# Patient Record
Sex: Female | Born: 1990 | Race: White | Hispanic: No | Marital: Married | Smoking: Never smoker
Health system: Southern US, Community
[De-identification: ages and names within clinical notes are randomized; demographics above are authoritative.]

## PROBLEM LIST (undated history)

## (undated) DIAGNOSIS — E079 Disorder of thyroid, unspecified: Secondary | ICD-10-CM

---

## 2016-08-14 ENCOUNTER — Emergency Department (HOSPITAL_COMMUNITY)
Admission: EM | Admit: 2016-08-14 | Discharge: 2016-08-14 | Disposition: A | Discharge status: Home / Self Care | Payer: 59 | Attending: Emergency Medicine | Admitting: Emergency Medicine

## 2016-08-14 ENCOUNTER — Encounter (HOSPITAL_COMMUNITY): Payer: Self-pay | Admitting: *Deleted

## 2016-08-14 DIAGNOSIS — R000 Tachycardia, unspecified: Secondary | ICD-10-CM | POA: Insufficient documentation

## 2016-08-14 HISTORY — DX: Disorder of thyroid, unspecified: E07.9

## 2016-08-14 LAB — COMPREHENSIVE METABOLIC PANEL
ALT: 23 U/L (ref 14–54)
AST: 31 U/L (ref 15–41)
Albumin: 4.4 g/dL (ref 3.5–5.0)
Alkaline Phosphatase: 37 U/L — ABNORMAL LOW (ref 38–126)
Anion gap: 9 (ref 5–15)
BILIRUBIN TOTAL: 0.6 mg/dL (ref 0.3–1.2)
BUN: 11 mg/dL (ref 6–20)
CO2: 23 mmol/L (ref 22–32)
Calcium: 9.1 mg/dL (ref 8.9–10.3)
Chloride: 105 mmol/L (ref 101–111)
Creatinine, Ser: 0.96 mg/dL (ref 0.44–1.00)
Glucose, Bld: 110 mg/dL — ABNORMAL HIGH (ref 65–99)
POTASSIUM: 3.6 mmol/L (ref 3.5–5.1)
Sodium: 137 mmol/L (ref 135–145)
TOTAL PROTEIN: 7.1 g/dL (ref 6.5–8.1)

## 2016-08-14 LAB — CBC WITH DIFFERENTIAL/PLATELET
BASOS ABS: 0 10*3/uL (ref 0.0–0.1)
Basophils Relative: 0 %
EOS PCT: 0 %
Eosinophils Absolute: 0 10*3/uL (ref 0.0–0.7)
HEMATOCRIT: 41.4 % (ref 36.0–46.0)
Hemoglobin: 13.6 g/dL (ref 12.0–15.0)
LYMPHS ABS: 2 10*3/uL (ref 0.7–4.0)
LYMPHS PCT: 28 %
MCH: 29.8 pg (ref 26.0–34.0)
MCHC: 32.9 g/dL (ref 30.0–36.0)
MCV: 90.6 fL (ref 78.0–100.0)
MONO ABS: 0.5 10*3/uL (ref 0.1–1.0)
MONOS PCT: 7 %
NEUTROS ABS: 4.6 10*3/uL (ref 1.7–7.7)
Neutrophils Relative %: 65 %
Platelets: 350 10*3/uL (ref 150–400)
RBC: 4.57 MIL/uL (ref 3.87–5.11)
RDW: 12.5 % (ref 11.5–15.5)
WBC: 7.2 10*3/uL (ref 4.0–10.5)

## 2016-08-14 LAB — URINALYSIS, ROUTINE W REFLEX MICROSCOPIC
BILIRUBIN URINE: NEGATIVE
Glucose, UA: NEGATIVE mg/dL
HGB URINE DIPSTICK: NEGATIVE
Ketones, ur: 15 mg/dL — AB
Leukocytes, UA: NEGATIVE
NITRITE: NEGATIVE
PROTEIN: NEGATIVE mg/dL
Specific Gravity, Urine: 1.006 (ref 1.005–1.030)
pH: 8 (ref 5.0–8.0)

## 2016-08-14 LAB — PREGNANCY, URINE: PREG TEST UR: NEGATIVE

## 2016-08-14 LAB — D-DIMER, QUANTITATIVE: D-Dimer, Quant: <0.27 ug/mL-FEU (ref 0.00–0.50)

## 2016-08-14 LAB — CBG MONITORING, ED: Glucose-Capillary: 79 mg/dL (ref 65–99)

## 2016-08-14 LAB — T4, FREE: FREE T4: 0.88 ng/dL (ref 0.61–1.12)

## 2016-08-14 LAB — TSH: TSH: 1.244 u[IU]/mL (ref 0.350–4.500)

## 2016-08-14 MED ORDER — PROPRANOLOL HCL 1 MG/ML IV SOLN
1.0000 mg | Freq: Once | INTRAVENOUS | Status: AC
  Administered 2016-08-14: 1 mg via INTRAVENOUS
  Filled 2016-08-14: qty 1

## 2016-08-14 MED ORDER — SODIUM CHLORIDE 0.9 % IV BOLUS (SEPSIS)
1000.0000 mL | Freq: Once | INTRAVENOUS | Status: AC
  Administered 2016-08-14: 1000 mL via INTRAVENOUS

## 2016-08-14 NOTE — ED Notes (Signed)
The pt  Is alert skin warm and  Dry  No distress.  lmp none   She is on bc

## 2016-08-14 NOTE — ED Provider Notes (Signed)
[facility]-EMERGENCY DEPT Provider Note   CSN: [id number] Arrival date & time: 08/14/16  0109  By signing my name below, I, Caroline Strickland, attest that this documentation has been prepared under the direction and in the presence of [name], MD . Electronically Signed: Majel HomerPeyton Strickland, Scribe. 08/14/2016. 1:31 AM.  History   Chief Complaint Chief Complaint  Patient presents with  . Tachycardia   The history is provided by the patient. No language interpreter was used.   HPI Comments: Caroline Strickland is a 25 y.o. female with PMHx of hypothyroidism, BIB EMS to the Emergency Department for an evaluation of gradually worsening, sensation of palpitations and lightheadedness described as "spaciness" that began several weeks ago and worsened a few minutes PTA. Pt reports hx of SVT and states her symptoms feel "somewhat similar;" she notes she just finished wearing a Holter monitor. She notes increased dizziness with movement and nausea. Pt states she was recently told she may have POTS but was then diagnosed with hyperthyroidism. She denies vomiting, chest pain, shortness of breath, diaphoresis and leg swelling. Pt notes she is followed by a [facility] at [facility].   Past Medical History:  Diagnosis Date  . Thyroid disease     There are no active problems to display for this patient.   No past surgical history on file.  OB History    No data available       Home Medications    Prior to Admission medications   Medication Sig Start Date End Date Taking? Authorizing Provider  amphetamine-dextroamphetamine (ADDERALL) 15 MG tablet Take 15 mg by mouth daily as needed (ADHD).    Yes Historical Provider, MD  levonorgestrel (MIRENA) 20 MCG/24HR IUD 1 each by Intrauterine route once.   Yes Historical Provider, MD  midodrine (PROAMATINE) 5 MG tablet Take 5 mg by mouth daily as needed (low blood pressure).   Yes Historical Provider, MD  Multiple Vitamin (MULTIVITAMIN WITH MINERALS) TABS tablet Take 1  tablet by mouth daily.   Yes Historical Provider, MD  nebivolol (BYSTOLIC) 2.5 MG tablet Take 2.5 mg by mouth 2 (two) times daily.   Yes Historical Provider, MD  propylthiouracil (PTU) 50 MG tablet Take 50 mg by mouth 2 (two) times daily.   Yes Historical Provider, MD    Family History No family history on file.  Social History Social History  Substance Use Topics  . Smoking status: Not on file  . Smokeless tobacco: Not on file  . Alcohol use Not on file     Allergies   Ciprofloxacin   Review of Systems Review of Systems  Constitutional: Negative for chills, diaphoresis and fever.  Eyes: Negative for visual disturbance.  Respiratory: Negative for chest tightness and shortness of breath.   Cardiovascular: Positive for palpitations. Negative for chest pain and leg swelling.  Gastrointestinal: Positive for nausea. Negative for abdominal pain and vomiting.  Musculoskeletal: Negative for back pain, myalgias, neck pain and neck stiffness.  Skin: Negative for rash and wound.  Neurological: Positive for dizziness and light-headedness. Negative for syncope, weakness, numbness and headaches.  All other systems reviewed and are negative.  Physical Exam Updated Vital Signs BP 131/89   Pulse (!) 121   Resp 21   Ht 5\' 7"  (1.702 m)   Wt 150 lb (68 kg)   SpO2 100%   BMI 23.49 kg/m   Physical Exam  Constitutional: She is oriented to person, place, and time. She appears well-developed and well-nourished.  Anxious appearing  HENT:  Head: Normocephalic  and atraumatic.  Mouth/Throat: Oropharynx is clear and moist.  Eyes: EOM are normal. Pupils are equal, round, and reactive to light.  Neck: Normal range of motion. Neck supple. No thyromegaly present.  Cardiovascular: Regular rhythm.  Exam reveals no gallop and no friction rub.   No murmur heard. Tachycardia  Pulmonary/Chest: Effort normal and breath sounds normal. No respiratory distress. She has no wheezes. She has no rales. She  exhibits no tenderness.  Abdominal: Soft. Bowel sounds are normal. There is no tenderness. There is no rebound and no guarding.  Musculoskeletal: Normal range of motion. She exhibits no edema or tenderness.  No lower extremity swelling, asymmetry or tenderness. Distal pulses are equal and 2+. No tremor noted.  Neurological: She is alert and oriented to person, place, and time.  Patient is alert and oriented x3 with clear, goal oriented speech. Patient has 5/5 motor in all extremities. Sensation is intact to light touch.   Skin: Skin is warm and dry. Capillary refill takes less than 2 seconds. No rash noted. No erythema.  Psychiatric: Her behavior is normal.  Nursing note and vitals reviewed.    ED Treatments / Results  Labs (all labs ordered are listed, but only abnormal results are displayed) Labs Reviewed  COMPREHENSIVE METABOLIC PANEL - Abnormal; Notable for the following:       Result Value   Glucose, Bld 110 (*)    Alkaline Phosphatase 37 (*)    All other components within normal limits  URINALYSIS, ROUTINE W REFLEX MICROSCOPIC (NOT AT [facility]) - Abnormal; Notable for the following:    Ketones, ur 15 (*)    All other components within normal limits  CBC WITH DIFFERENTIAL/PLATELET  PREGNANCY, URINE  D-DIMER, QUANTITATIVE (NOT AT [facility])  TSH  T4, FREE  CBG MONITORING, ED    EKG  EKG Interpretation  Date/Time:  Thursday August 14 2016 01:25:32 EDT Ventricular Rate:  141 PR Interval:    QRS Duration: 95 QT Interval:  373 QTC Calculation: 572 R Axis:   4 Text Interpretation:  Sinus tachycardia S1,S2,S3 pattern RSR' in V1 or V2, right VCD or RVH Prolonged QT interval Artifact in lead(s) II III aVR aVL aVF V1 V2 V3 V4 V5 V6 Confirmed by Ranae Palms  MD, Melanye Hiraldo ([id number]) on 08/14/2016 1:39:17 AM       Radiology No results found.  Procedures Procedures (including critical care time)  Medications Ordered in ED Medications  sodium chloride 0.9 % bolus 1,000 mL (0 mLs  Intravenous Stopped 08/14/16 0344)  propranolol (INDERAL) injection 1 mg (1 mg Intravenous Given 08/14/16 0152)     Initial Impression / Assessment and Plan / ED Course  I have reviewed the triage vital signs and the nursing notes.  Pertinent labs & imaging results that were available during my care of the patient were reviewed by me and considered in my medical decision making (see chart for details).  Clinical Course  DIAGNOSTIC STUDIES:     COORDINATION OF CARE:  1:27 AM Discussed treatment plan with pt at bedside and pt agreed to plan.  Heart rate down to 100 and emergency department. She is well-appearing. Thyroid studies are normal. Think tachycardia likely related to pots versus anxiety. Will discharge patient to follow-up with her cardiologist. Return precautions given.  I personally performed the services described in this documentation, which was scribed in my presence. The recorded information has been reviewed and is accurate.   Final Clinical Impressions(s) / ED Diagnoses   Final diagnoses:  Tachycardia  New Prescriptions New Prescriptions   No medications on file     Loren Racer, MD 08/14/16 [phone]

## 2016-08-14 NOTE — ED Notes (Signed)
SHE WAS GIVEN 6MG  ADENOCARD BY EMS

## 2016-08-14 NOTE — ED Triage Notes (Signed)
The pt arrived by gems from work  She had  A rap[id heart rate tonight while in the br.  She has had taCHYCARDIA FOR SEVERAL WEEKS AND  JUST FINISHED WEARING A HOLTER MONITOR  HER DOCTORS ARE AT [facility]  SHE JUST MOVED TO TOWN  HR  138  NO CXHEST PAIN JUST NAUSEA

## 2016-11-10 ENCOUNTER — Encounter: Payer: Self-pay | Admitting: Family Medicine

## 2016-11-10 ENCOUNTER — Ambulatory Visit (INDEPENDENT_AMBULATORY_CARE_PROVIDER_SITE_OTHER): Payer: 59 | Admitting: Family Medicine

## 2016-11-10 DIAGNOSIS — I951 Orthostatic hypotension: Secondary | ICD-10-CM

## 2016-11-10 DIAGNOSIS — R000 Tachycardia, unspecified: Secondary | ICD-10-CM

## 2016-11-10 DIAGNOSIS — G90A Postural orthostatic tachycardia syndrome (POTS): Secondary | ICD-10-CM

## 2016-11-10 NOTE — Progress Notes (Signed)
Medical Nutrition Therapy:  Appt start time: 1330 end time:  1430. Cardiologist (for POTS): Erenest Rasheramille Frazier-Mills, MD ([facility]) PCP:  Creola CornJohn Russo, MD [facility]) Endocrinologist: Wendall MolaMelissa Solum, MD ([facility])  Assessment:  Primary concerns today: gasroparesis and POTS.  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  She would like to learn the best food choices for her condition.  The only info she has received related to diet for POTS is to avoid foods with histamines in them and possibly dairy foods.  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      Learning Readiness: Change in progress  Usual eating pattern includes 0 meals and 6-10 snacks per day (small, frequent meals prescribed for POTS).   Frequent foods and beverages include water, almond milk, ginger ale (~1 X wk for stomach ache), 1 c coffee with flvrd cream, chx, whey protein powder (20 g) in alm milk w/ pinch-1/8 tsp salt added, ice crm, carrots, melon, Grk yogurt.  Avoided foods include chips, most highly processed foods, fast fd, fried foods (poor digestion), fish, seafood, eggs (histamines), most veg's.  Caroline Strickland acknowledges that it makes no sense that she tolerates ice cream reasonably well (and better than yogurt usually), but cannot tolerate cheese, although she eats it as she does most foods - very slowly, a few bites at a time.    Usual physical activity includes 30-60 min gym workout (both strength and cardio) 5-6 X wk.  For cardio she uses the stat bike, and is adding a little standing cardio each week; up to 8 min on elliptical.    24-hr recall: (Up at 8:30 AM) B (8:30 AM)-   1 c coffee, 1 tsp flavored creamer 30 min workout Snk (9:30)-  1 banana, water Snk (11:30)-  [employer] 1/2 salad chx caesar salad,  water Snk (1 PM)-  6-8 oz ginger ale L (1:30 PM)-  1/2 [place] sandw Snk (5 PM)-  1-2 oz steak D (6-8 PM)-  3-4 oz rotisserie chx  Snk ( PM)-  --- Typical day? Yes.  except seldom eats out.  Caroline Strickland felt nauseated all day yesterday, from the time she first woke up.  This happens most days of the week, but usually only lasts a couple hours to half a day.    Progress Towards Goal(s):  In progress.   Nutritional Diagnosis:  -2.2 Altered nutrition-related laboratory As related to POTS and possible POTS-related gastroparesis.  As evidenced by diagnosed POTS and frequent nausea.    Intervention:  Nutrition education.   Handouts given during visit include:  AVS  Handout on diet for POTS.   Demonstrated degree of understanding via:  Teach Back  Barriers to learning/adherence to lifestyle change: Time constraints will become an issue once [place] starts school in January.  Another barrier is her dislike of most veg's and poor tolerance of most fruits, which may require some experimentation with preparation to overcome.    Monitoring/Evaluation:  Dietary intake, exercise, and body weight prn.

## 2016-11-10 NOTE — Patient Instructions (Addendum)
-   TASTE PREFERENCES ARE LEARNED.  This means that it will get easier to choose foods you know are good for you if you are exposed to them enough.    - The way to change taste preferences is to be consistent with a new food practice.    - Alternatives to bananas: Cantaloupe is an excellent K+ source, and most fruits and veg's are good to excellent potassium sources.   - Minimize fats. When you have a food with a lot of fat, just have a very small amount.  Be especially wary of getting both fat and fiber. Soluble fiber tends to be easier to digest.    - Most grains will have more insoluble fiber than soluble, which you may find harder to digest.  (Exception: rice.) - Keep in mind the potential cumulative effect of foods, i.e., following a food with fiber with a food with fat may cause digestive problems you won't have with either alone.    - Rule of thumb:  Allow at least 2 hrs between eating when you can - IF one of those foods is potentially problematic.   - Your diet is currently pretty low in fruits and veg's, so if you can add any, that will be a good move.    - Cooked vs. Raw veg's and fruits (apple sauce) are easier to digest.     - Try stir-frying (minimal fat added and minimal heat exposure).  (To minimize fat, use as little as possible, then finish cooking the last 1-2 minutes by adding a tiny amt of water, covering, and turning off the heat.)  - Add fruits and veg's slowly (start with 1/4 cup) and progressively to your diet.    - Adding salt to cooked veg's will be a great way to increase sodium to a high-potassium food.   - Almond milk:  Be sure to shake VIGOROUSLY every time you use it to get the fortified nutrients.    - Email [email or web address] to remind me to get back to you with more info:    1. Your Q re. Effects of heating foods and nitrate formation.  2. Best K+ supplement.   3. List of soluble vs. insol fiber.    4. RD with expertise in POTS in our area.

## 2017-04-28 ENCOUNTER — Other Ambulatory Visit: Payer: Self-pay | Admitting: Internal Medicine

## 2017-04-28 DIAGNOSIS — R221 Localized swelling, mass and lump, neck: Secondary | ICD-10-CM

## 2017-05-01 ENCOUNTER — Ambulatory Visit
Admission: RE | Admit: 2017-05-01 | Discharge: 2017-05-01 | Disposition: A | Discharge status: Home / Self Care | Payer: 59 | Source: Ambulatory Visit | Attending: Internal Medicine | Admitting: Internal Medicine

## 2017-05-01 DIAGNOSIS — R221 Localized swelling, mass and lump, neck: Secondary | ICD-10-CM

## 2017-05-05 ENCOUNTER — Other Ambulatory Visit: Payer: Self-pay | Admitting: Internal Medicine

## 2017-05-05 DIAGNOSIS — R590 Localized enlarged lymph nodes: Secondary | ICD-10-CM

## 2017-05-08 ENCOUNTER — Other Ambulatory Visit: Payer: 59

## 2017-05-22 ENCOUNTER — Ambulatory Visit
Admission: RE | Admit: 2017-05-22 | Discharge: 2017-05-22 | Disposition: A | Discharge status: Home / Self Care | Payer: 59 | Source: Ambulatory Visit | Attending: Internal Medicine | Admitting: Internal Medicine

## 2017-05-22 DIAGNOSIS — R590 Localized enlarged lymph nodes: Secondary | ICD-10-CM

## 2017-05-22 MED ORDER — IOPAMIDOL (ISOVUE-300) INJECTION 61%
75.0000 mL | Freq: Once | INTRAVENOUS | Status: AC | PRN
  Administered 2017-05-22: 75 mL via INTRAVENOUS

## 2017-08-11 ENCOUNTER — Other Ambulatory Visit: Payer: Self-pay | Admitting: Orthopedic Surgery

## 2017-08-11 DIAGNOSIS — M24411 Recurrent dislocation, right shoulder: Secondary | ICD-10-CM

## 2017-08-17 ENCOUNTER — Ambulatory Visit: Payer: 59

## 2017-08-19 ENCOUNTER — Ambulatory Visit
Admission: RE | Admit: 2017-08-19 | Discharge: 2017-08-19 | Disposition: A | Discharge status: Home / Self Care | Payer: 59 | Source: Ambulatory Visit | Attending: Orthopedic Surgery | Admitting: Orthopedic Surgery

## 2017-08-19 DIAGNOSIS — M24411 Recurrent dislocation, right shoulder: Secondary | ICD-10-CM

## 2017-08-19 MED ORDER — GADOBENATE DIMEGLUMINE 529 MG/ML IV SOLN
0.1000 mL | Freq: Once | INTRAVENOUS | Status: AC | PRN
  Administered 2017-08-19: 0.1 mL via INTRA_ARTICULAR

## 2017-08-19 MED ORDER — LIDOCAINE HCL (PF) 1 % IJ SOLN
10.0000 mL | Freq: Once | INTRAMUSCULAR | Status: AC
  Administered 2017-08-19: 5 mL
  Filled 2017-08-19: qty 10

## 2017-08-19 MED ORDER — IOPAMIDOL (ISOVUE-200) INJECTION 41%
5.0000 mL | Freq: Once | INTRAVENOUS | Status: AC | PRN
  Administered 2017-08-19: 1 mL
  Filled 2017-08-19: qty 50

## 2017-11-15 IMAGING — MR MR SHOULDER*R* W/CM
6 of 7 series · 32 of 40 positions shown · IV contrast (agent unspecified)
Comparison: None.

CLINICAL DATA: Recurrent right shoulder dislocations.

EXAM:
MR ARTHROGRAM OF THE RIGHT SHOULDER
TECHNIQUE: Multiplanar, multisequence MR imaging of the right shoulder was
performed following the administration of intra-articular contrast.
CONTRAST:  See Injection Documentation.

[Series 3: T1 fat-sat · axial · 4.0mm · 0.47mm/px · z∈[-34,+72]mm · 7 of 25 slices shown (1 of 2)]
[im 1/25]
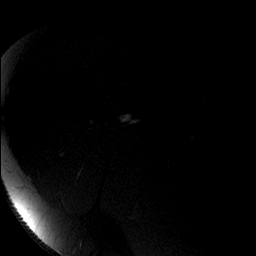
[im 5/25]
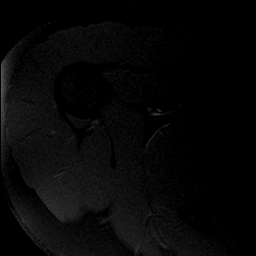
[im 9/25]
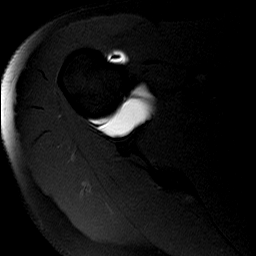
[im 13/25]
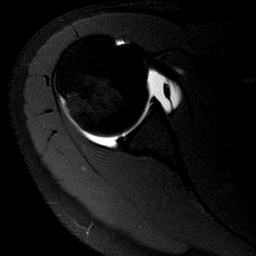
[im 17/25]
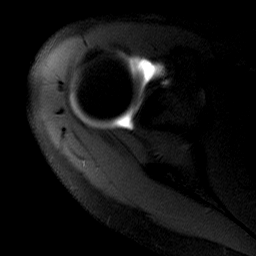
[im 21/25]
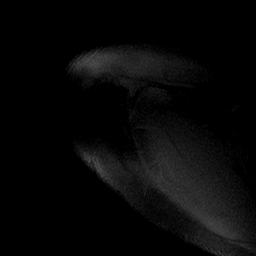
[im 25/25]
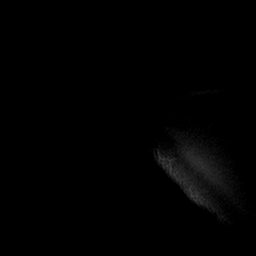

[Series 4: t1_coronal_fs · oblique · 4.0mm · 0.31mm/px · 3 of 21 slices shown]
[im 1/21]
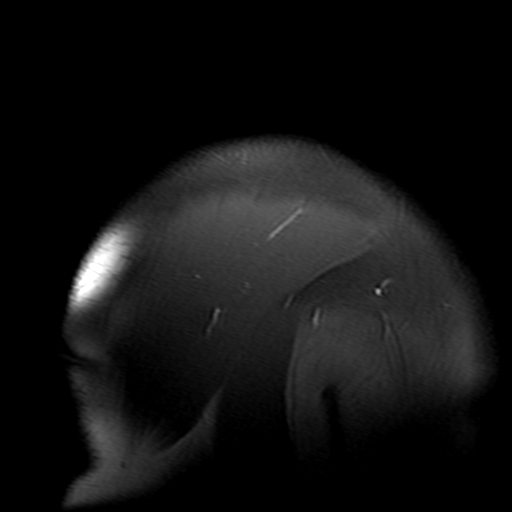
[im 5/21]
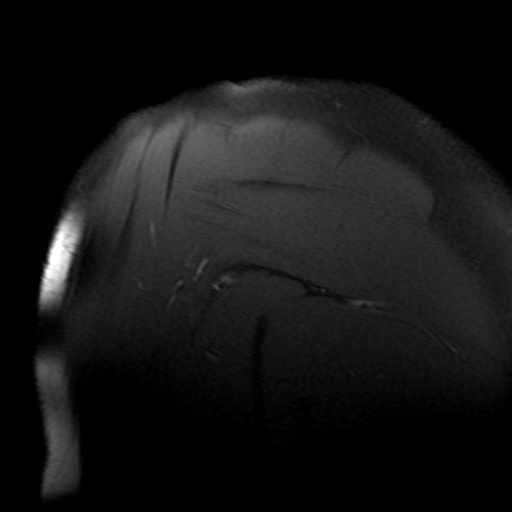
[im 9/21]
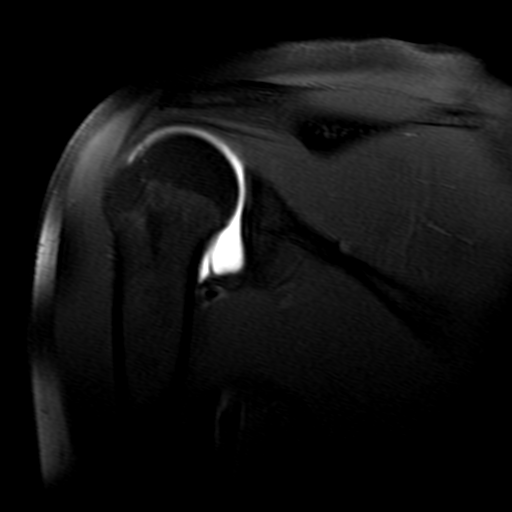

[Series 5: T2 fat-sat · oblique · 4.0mm · 0.62mm/px · 5 of 21 slices shown (1 of 2)]
[im 1/21]
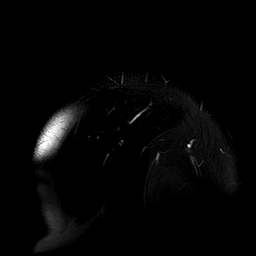
[im 6/21]
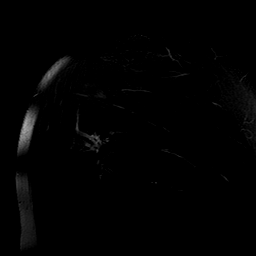
[im 11/21]
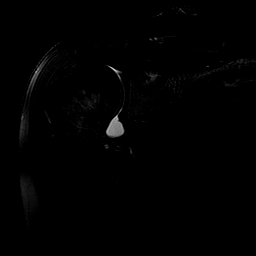
[im 16/21]
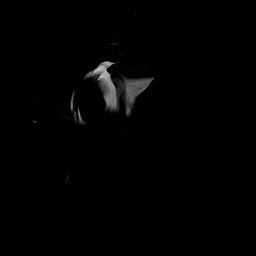
[im 21/21]
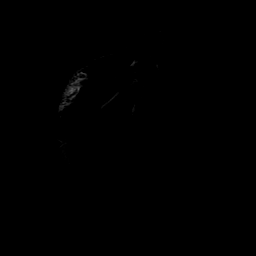

[Series 6: T1 · oblique · 4.0mm · 0.31mm/px · 5 of 21 slices shown]
[im 1/21]
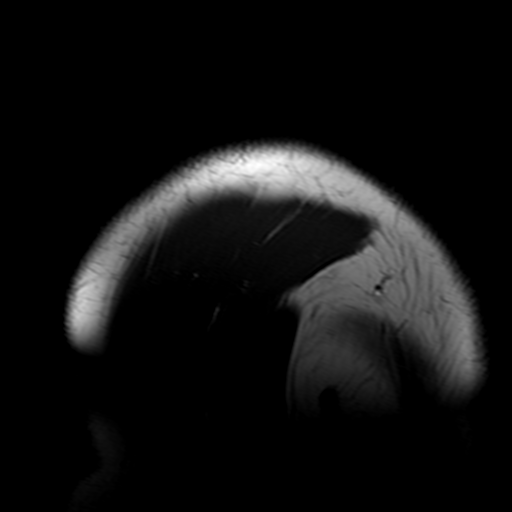
[im 6/21]
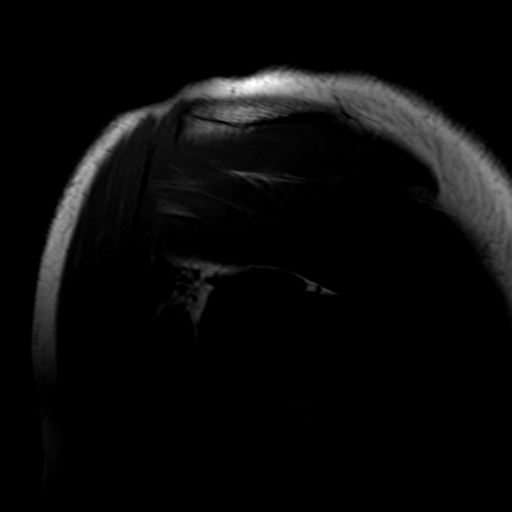
[im 11/21]
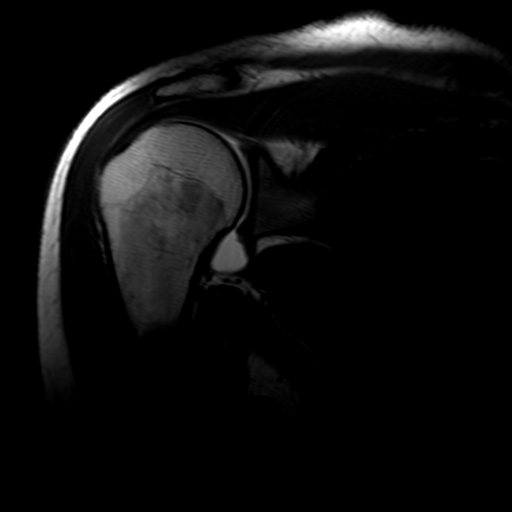
[im 16/21]
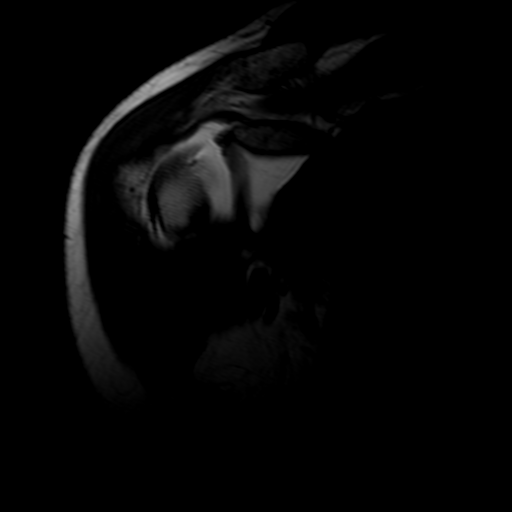
[im 21/21]
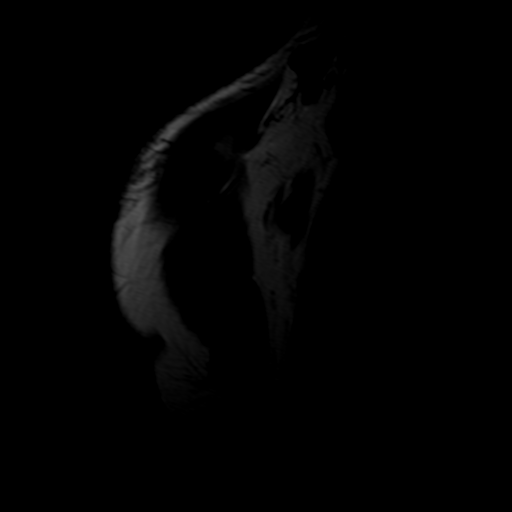

[Series 7: T2 fat-sat · oblique · 4.0mm · 0.62mm/px · 6 of 23 slices shown (2 of 2)]
[im 1/23]
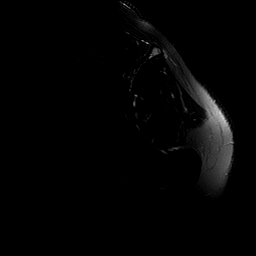
[im 5/23]
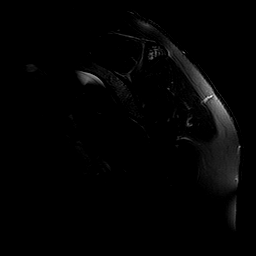
[im 9/23]
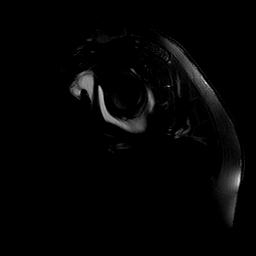
[im 14/23]
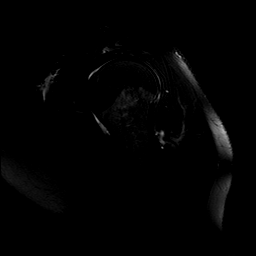
[im 18/23]
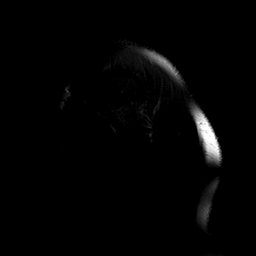
[im 23/23]
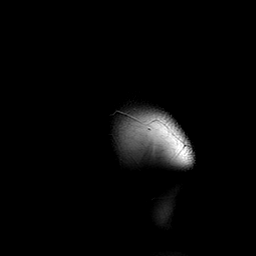

[Series 11: T1 fat-sat · sagittal · 4.0mm · 0.35mm/px · 6 of 23 slices shown (2 of 2)]
[im 1/23]
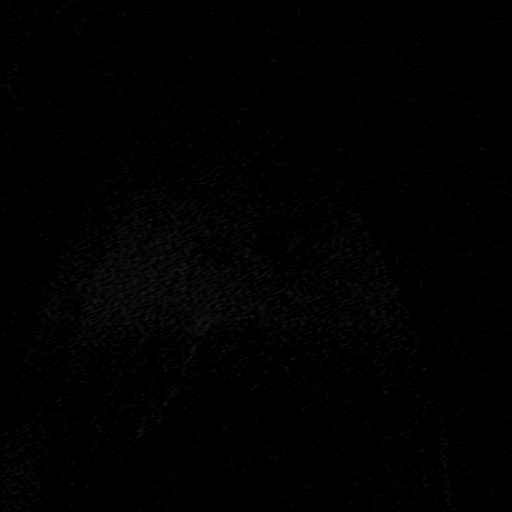
[im 5/23]
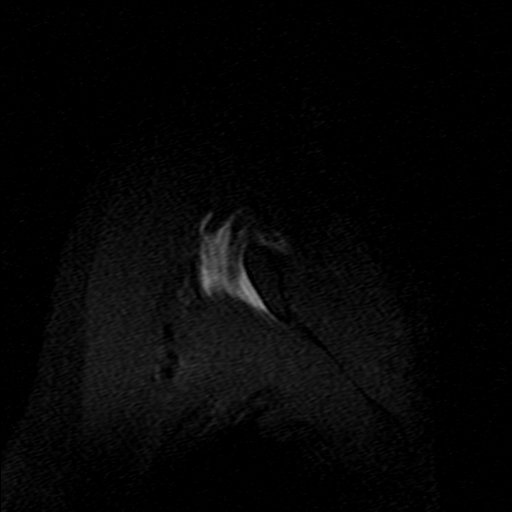
[im 9/23]
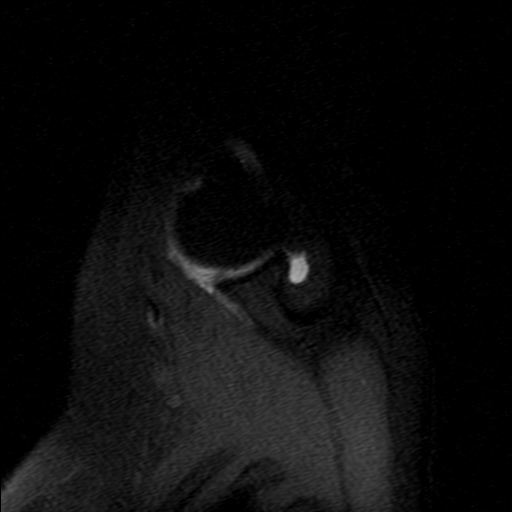
[im 14/23]
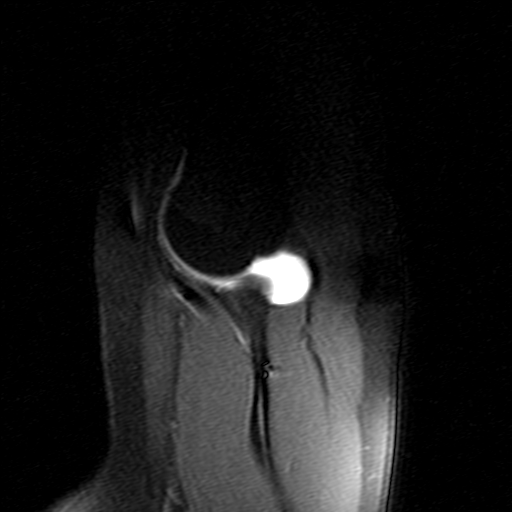
[im 18/23]
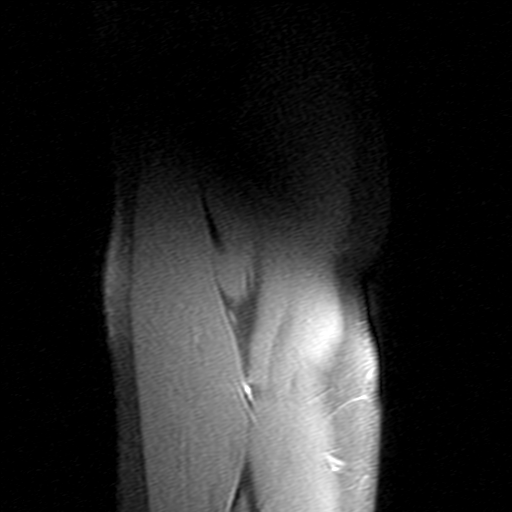
[im 23/23]
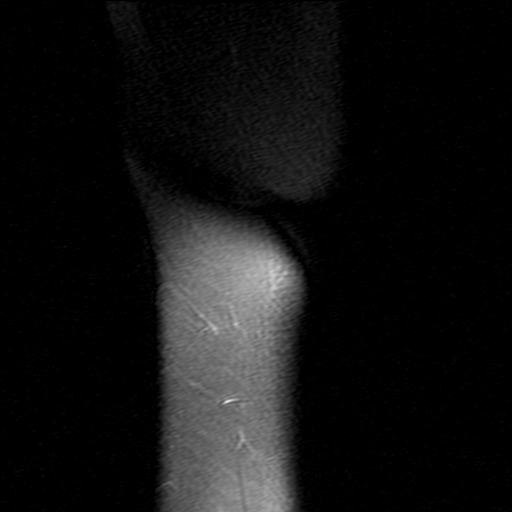

[32 of 40 positions shown; findings below may reference images not displayed]

FINDINGS: Rotator cuff:  Intact.

Muscles:  No focal muscular atrophy or edema.

Biceps long head:  Intact and normally positioned.

Acromioclavicular Joint: The acromion is type II. Normal
acromioclavicular joint. No significant fluid is present in the
subacromial/subdeltoid bursa.

Glenohumeral Joint: Distended with intra-articular contrast. No
chondral defect.

Labrum: Irregularity of the anterior labrum (series 3, image 12,
13).

Bones: No acute or significant extra-articular osseous findings.

Other: No significant soft tissue findings.
IMPRESSION: 1. Minimal irregularity of the anterior labrum, which could reflect
a small tear.
